# Patient Record
Sex: Male | Born: 1940 | Race: White | Hispanic: No | Marital: Single | Smoking: Never smoker
Health system: Southern US, Community
[De-identification: ages and names within clinical notes are randomized; demographics above are authoritative.]

## PROBLEM LIST (undated history)

## (undated) DIAGNOSIS — I10 Essential (primary) hypertension: Secondary | ICD-10-CM

## (undated) DIAGNOSIS — I214 Non-ST elevation (NSTEMI) myocardial infarction: Secondary | ICD-10-CM

## (undated) DIAGNOSIS — E119 Type 2 diabetes mellitus without complications: Secondary | ICD-10-CM

## (undated) DIAGNOSIS — I2510 Atherosclerotic heart disease of native coronary artery without angina pectoris: Secondary | ICD-10-CM

## (undated) DIAGNOSIS — E785 Hyperlipidemia, unspecified: Secondary | ICD-10-CM

## (undated) HISTORY — PX: CATARACT EXTRACTION: SUR2

## (undated) HISTORY — PX: CARDIAC CATHETERIZATION: SHX172

## (undated) HISTORY — PX: CORONARY STENT PLACEMENT: SHX1402

## (undated) HISTORY — PX: APPENDECTOMY: SHX54

---

## 2005-01-20 DIAGNOSIS — I2510 Atherosclerotic heart disease of native coronary artery without angina pectoris: Secondary | ICD-10-CM

## 2005-01-20 HISTORY — DX: Atherosclerotic heart disease of native coronary artery without angina pectoris: I25.10

## 2005-08-20 ENCOUNTER — Encounter: Payer: Self-pay | Admitting: Internal Medicine

## 2005-09-20 ENCOUNTER — Encounter: Payer: Self-pay | Admitting: Internal Medicine

## 2005-10-20 ENCOUNTER — Encounter: Payer: Self-pay | Admitting: Internal Medicine

## 2005-11-20 ENCOUNTER — Encounter: Payer: Self-pay | Admitting: Internal Medicine

## 2006-01-20 HISTORY — PX: COLONOSCOPY: SHX174

## 2007-01-05 ENCOUNTER — Ambulatory Visit: Payer: Self-pay | Admitting: Gastroenterology

## 2007-07-24 ENCOUNTER — Other Ambulatory Visit: Payer: Self-pay

## 2007-07-24 ENCOUNTER — Emergency Department: Payer: Self-pay | Admitting: Emergency Medicine

## 2007-08-02 ENCOUNTER — Ambulatory Visit: Payer: Self-pay | Admitting: Oncology

## 2007-08-21 ENCOUNTER — Ambulatory Visit: Payer: Self-pay | Admitting: Oncology

## 2012-04-28 ENCOUNTER — Ambulatory Visit: Payer: Self-pay | Admitting: Gastroenterology

## 2012-04-28 HISTORY — PX: COLONOSCOPY: SHX174

## 2013-01-20 DIAGNOSIS — I214 Non-ST elevation (NSTEMI) myocardial infarction: Secondary | ICD-10-CM

## 2013-01-20 HISTORY — DX: Non-ST elevation (NSTEMI) myocardial infarction: I21.4

## 2013-01-21 ENCOUNTER — Inpatient Hospital Stay: Payer: Self-pay | Admitting: Family Medicine

## 2013-01-21 LAB — CBC WITH DIFFERENTIAL/PLATELET
BASOS PCT: 0.5 %
Basophil #: 0.1 10*3/uL (ref 0.0–0.1)
Eosinophil #: 0.1 10*3/uL (ref 0.0–0.7)
Eosinophil %: 0.9 %
HCT: 38.7 % — ABNORMAL LOW (ref 40.0–52.0)
HGB: 13 g/dL (ref 13.0–18.0)
LYMPHS ABS: 1.2 10*3/uL (ref 1.0–3.6)
Lymphocyte %: 10 %
MCH: 28.7 pg (ref 26.0–34.0)
MCHC: 33.6 g/dL (ref 32.0–36.0)
MCV: 86 fL (ref 80–100)
MONOS PCT: 3.3 %
Monocyte #: 0.4 x10 3/mm (ref 0.2–1.0)
NEUTROS ABS: 10.1 10*3/uL — AB (ref 1.4–6.5)
Neutrophil %: 85.3 %
PLATELETS: 180 10*3/uL (ref 150–440)
RBC: 4.53 10*6/uL (ref 4.40–5.90)
RDW: 13.6 % (ref 11.5–14.5)
WBC: 11.9 10*3/uL — ABNORMAL HIGH (ref 3.8–10.6)

## 2013-01-21 LAB — COMPREHENSIVE METABOLIC PANEL
ALBUMIN: 4 g/dL (ref 3.4–5.0)
ALK PHOS: 107 U/L
AST: 33 U/L (ref 15–37)
Anion Gap: 5 — ABNORMAL LOW (ref 7–16)
BILIRUBIN TOTAL: 0.4 mg/dL (ref 0.2–1.0)
BUN: 29 mg/dL — ABNORMAL HIGH (ref 7–18)
CALCIUM: 9.4 mg/dL (ref 8.5–10.1)
CREATININE: 1.36 mg/dL — AB (ref 0.60–1.30)
Chloride: 108 mmol/L — ABNORMAL HIGH (ref 98–107)
Co2: 24 mmol/L (ref 21–32)
GFR CALC AF AMER: 60 — AB
GFR CALC NON AF AMER: 52 — AB
Glucose: 97 mg/dL (ref 65–99)
OSMOLALITY: 280 (ref 275–301)
Potassium: 4.4 mmol/L (ref 3.5–5.1)
SGPT (ALT): 20 U/L (ref 12–78)
Sodium: 137 mmol/L (ref 136–145)
Total Protein: 6.9 g/dL (ref 6.4–8.2)

## 2013-01-21 LAB — CK-MB
CK-MB: 20.6 ng/mL — ABNORMAL HIGH (ref 0.5–3.6)
CK-MB: 40.9 ng/mL — ABNORMAL HIGH (ref 0.5–3.6)

## 2013-01-21 LAB — APTT: Activated PTT: 27.4 secs (ref 23.6–35.9)

## 2013-01-21 LAB — PRO B NATRIURETIC PEPTIDE: B-TYPE NATIURETIC PEPTID: 52 pg/mL (ref 0–125)

## 2013-01-21 LAB — TROPONIN I
TROPONIN-I: 1.8 ng/mL — AB
Troponin-I: 0.12 ng/mL — ABNORMAL HIGH
Troponin-I: 0.7 ng/mL — ABNORMAL HIGH

## 2013-01-22 LAB — CBC WITH DIFFERENTIAL/PLATELET
Basophil #: 0.1 10*3/uL (ref 0.0–0.1)
Basophil %: 0.8 %
EOS ABS: 0.6 10*3/uL (ref 0.0–0.7)
Eosinophil %: 5.8 %
HCT: 35.2 % — ABNORMAL LOW (ref 40.0–52.0)
HGB: 12.2 g/dL — ABNORMAL LOW (ref 13.0–18.0)
LYMPHS ABS: 2.8 10*3/uL (ref 1.0–3.6)
Lymphocyte %: 25.5 %
MCH: 29.7 pg (ref 26.0–34.0)
MCHC: 34.7 g/dL (ref 32.0–36.0)
MCV: 86 fL (ref 80–100)
MONOS PCT: 7.2 %
Monocyte #: 0.8 x10 3/mm (ref 0.2–1.0)
NEUTROS ABS: 6.6 10*3/uL — AB (ref 1.4–6.5)
Neutrophil %: 60.7 %
Platelet: 166 10*3/uL (ref 150–440)
RBC: 4.1 10*6/uL — ABNORMAL LOW (ref 4.40–5.90)
RDW: 13.7 % (ref 11.5–14.5)
WBC: 10.9 10*3/uL — ABNORMAL HIGH (ref 3.8–10.6)

## 2013-01-22 LAB — BASIC METABOLIC PANEL
ANION GAP: 7 (ref 7–16)
BUN: 30 mg/dL — AB (ref 7–18)
CHLORIDE: 108 mmol/L — AB (ref 98–107)
CO2: 26 mmol/L (ref 21–32)
Calcium, Total: 8.9 mg/dL (ref 8.5–10.1)
Creatinine: 1.44 mg/dL — ABNORMAL HIGH (ref 0.60–1.30)
EGFR (African American): 56 — ABNORMAL LOW
EGFR (Non-African Amer.): 48 — ABNORMAL LOW
Glucose: 109 mg/dL — ABNORMAL HIGH (ref 65–99)
OSMOLALITY: 288 (ref 275–301)
Potassium: 4 mmol/L (ref 3.5–5.1)
SODIUM: 141 mmol/L (ref 136–145)

## 2013-01-22 LAB — APTT
Activated PTT: >160 secs (ref 23.6–35.9)
Activated PTT: 113.6 secs — ABNORMAL HIGH (ref 23.6–35.9)
Activated PTT: 62.2 secs — ABNORMAL HIGH (ref 23.6–35.9)
Activated PTT: 68.1 secs — ABNORMAL HIGH (ref 23.6–35.9)

## 2013-01-22 LAB — CK-MB: CK-MB: 132.6 ng/mL — ABNORMAL HIGH (ref 0.5–3.6)

## 2013-01-22 LAB — TROPONIN I: TROPONIN-I: 10 ng/mL — AB

## 2013-01-23 LAB — COMPREHENSIVE METABOLIC PANEL
ALT: 29 U/L (ref 12–78)
ANION GAP: 3 — AB (ref 7–16)
AST: 127 U/L — AB (ref 15–37)
Albumin: 3.4 g/dL (ref 3.4–5.0)
Alkaline Phosphatase: 94 U/L
BUN: 28 mg/dL — AB (ref 7–18)
Bilirubin,Total: 0.5 mg/dL (ref 0.2–1.0)
CALCIUM: 9.1 mg/dL (ref 8.5–10.1)
CHLORIDE: 109 mmol/L — AB (ref 98–107)
CREATININE: 1.28 mg/dL (ref 0.60–1.30)
Co2: 27 mmol/L (ref 21–32)
EGFR (African American): >60
GFR CALC NON AF AMER: 56 — AB
Glucose: 104 mg/dL — ABNORMAL HIGH (ref 65–99)
Osmolality: 283 (ref 275–301)
POTASSIUM: 4.9 mmol/L (ref 3.5–5.1)
SODIUM: 139 mmol/L (ref 136–145)
TOTAL PROTEIN: 6.2 g/dL — AB (ref 6.4–8.2)

## 2013-01-23 LAB — CBC WITH DIFFERENTIAL/PLATELET
BASOS ABS: 0.1 10*3/uL (ref 0.0–0.1)
BASOS PCT: 0.9 %
Eosinophil #: 0.9 10*3/uL — ABNORMAL HIGH (ref 0.0–0.7)
Eosinophil %: 10.5 %
HCT: 37.2 % — AB (ref 40.0–52.0)
HGB: 12.5 g/dL — ABNORMAL LOW (ref 13.0–18.0)
LYMPHS ABS: 2.6 10*3/uL (ref 1.0–3.6)
Lymphocyte %: 29.7 %
MCH: 28.9 pg (ref 26.0–34.0)
MCHC: 33.7 g/dL (ref 32.0–36.0)
MCV: 86 fL (ref 80–100)
MONOS PCT: 7.9 %
Monocyte #: 0.7 x10 3/mm (ref 0.2–1.0)
Neutrophil #: 4.5 10*3/uL (ref 1.4–6.5)
Neutrophil %: 51 %
Platelet: 150 10*3/uL (ref 150–440)
RBC: 4.33 10*6/uL — ABNORMAL LOW (ref 4.40–5.90)
RDW: 14.1 % (ref 11.5–14.5)
WBC: 8.8 10*3/uL (ref 3.8–10.6)

## 2013-01-23 LAB — APTT: Activated PTT: 74.7 secs — ABNORMAL HIGH (ref 23.6–35.9)

## 2013-01-24 LAB — CK TOTAL AND CKMB (NOT AT ARMC)
CK, TOTAL: 219 U/L (ref 35–232)
CK-MB: 5.7 ng/mL — ABNORMAL HIGH (ref 0.5–3.6)

## 2013-01-24 LAB — APTT
ACTIVATED PTT: 57.4 s — AB (ref 23.6–35.9)
Activated PTT: 54.3 secs — ABNORMAL HIGH (ref 23.6–35.9)

## 2013-01-25 LAB — BASIC METABOLIC PANEL
Anion Gap: 4 — ABNORMAL LOW (ref 7–16)
BUN: 23 mg/dL — AB (ref 7–18)
CHLORIDE: 104 mmol/L (ref 98–107)
Calcium, Total: 8.8 mg/dL (ref 8.5–10.1)
Co2: 27 mmol/L (ref 21–32)
Creatinine: 1.28 mg/dL (ref 0.60–1.30)
GFR CALC NON AF AMER: 56 — AB
Glucose: 99 mg/dL (ref 65–99)
OSMOLALITY: 274 (ref 275–301)
POTASSIUM: 4.3 mmol/L (ref 3.5–5.1)
Sodium: 135 mmol/L — ABNORMAL LOW (ref 136–145)

## 2013-01-25 LAB — HEMOGLOBIN: HGB: 12.2 g/dL — ABNORMAL LOW (ref 13.0–18.0)

## 2013-01-25 LAB — PLATELET COUNT: Platelet: 148 10*3/uL — ABNORMAL LOW (ref 150–440)

## 2013-02-17 ENCOUNTER — Encounter: Payer: Self-pay | Admitting: Cardiology

## 2013-02-20 ENCOUNTER — Encounter: Payer: Self-pay | Admitting: Cardiology

## 2013-12-09 ENCOUNTER — Emergency Department: Payer: Self-pay | Admitting: Emergency Medicine

## 2013-12-13 ENCOUNTER — Emergency Department: Payer: Self-pay | Admitting: Emergency Medicine

## 2014-05-13 NOTE — H&P (Signed)
PATIENT NAME:  Omar Schmidt, [name] DATE OF BIRTH:  10-01-1940  DATE OF ADMISSION:  01/21/2013  PRIMARY CARE PHYSICIAN: Dr. Burnadette Schmidt  PRIMARY CARDIOLOGIST: Dr. Darrold Schmidt.   REFERRING EMERGENCY ROOM PHYSICIAN: Dr. Dorothea GlassmanPaul Schmidt.   CHIEF COMPLAINT: Chest pain.  HISTORY OF PRESENT ILLNESS: A 74 year old male with past medical history of a syncopal episode and followed by further work-up and [facility] two [facility]. History of  hypertension and hyperlipidemia. 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 So he sat down and decided to take a rest, but the pain did not go away for almost 1-1/2 hours, so he decided to go to the fire station, which was just nearby and they called the paramedics, checked his blood pressure and vitals and EKG. Everything was within normal limits, but the pain was very typical so they decided to bring him to the Emergency Room. On the way to ER, they gave him some aspirin and he said that after almost 2 hours, his pain started tapering down and gradually resolved.  Currently, he does not have any pain. In the ER, he was found having troponin is 0.12 so being admitted for further work-up of coronary artery disease as he has a strong history.    REVIEW OF SYSTEMS: CONSTITUTIONAL: Negative for fever, fatigue, weakness. Positive for chest pain.  EYES: No blurring, double vision, discharge or redness.  EARS, NOSE, THROAT: No tinnitus, ear pain or hearing loss.  RESPIRATORY: No cough, wheezing, hemoptysis, or shortness of breath.  CARDIOVASCULAR: Had some chest pain, but no orthopnea, palpitations or edema.  GASTROINTESTINAL: No nausea,  vomiting, diarrhea, abdominal pain.  GENITOURINARY: No dysuria, hematuria or increased frequency.  ENDOCRINE: No increased sweating. No heat or cold intolerance.  SKIN: No acne, rashes, or lesions.  MUSCULOSKELETAL: No pain or swelling in the joints.  NEUROLOGICAL: No numbness, weakness, tremor or vertigo.  PSYCHIATRIC: No anxiety, insomnia, bipolar disorder.   PAST MEDICAL HISTORY: Hypertension, hyperlipidemia, stent in 2 vessels 8 years ago.   PAST SURGICAL HISTORY: Appendix surgery almost 30 years ago.   SOCIAL HISTORY: He lives with wife, retired, was working at [employer]. Currently, very active retired life. 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 Denies smoking, drinking alcohol or illegal drug use.   FAMILY HISTORY: Positive for MI in his father in 5760s, but his 2 brothers, which were elder to him, they died at 4645 and 74 years of age because of MI.   HOME MEDICATIONS:  1.  Aspirin 81 mg daily.  2.  Atorvastatin 40 mg daily.  3.  Lisinopril 10 mg daily.   PHYSICAL EXAMINATION:   VITAL SIGNS:  In ER, temperature 98.6, pulse 62, respiration 18, blood pressure 127/84, pulse ox 89% on room air.  GENERAL: The patient is fully alert and oriented to time, place, and person. Does not appear in any acute distress.  HEENT: Head and neck atraumatic. Conjunctivae pink. Oral mucosa moist.  NECK: Supple. No JVD.  RESPIRATORY: Bilateral clear and equal air entry.  CARDIOVASCULAR: S1, S2 present,  regular. No murmur.  ABDOMEN: Soft, nontender. Bowel sounds present. No organomegaly.  SKIN: No rashes. LEGS: No edema.  NEUROLOGICAL: Power 5 out of 5. Follows commands. No gross abnormality.  JOINTS: No swelling or tenderness.  PSYCHIATRIC: Does not appear in any acute psychiatric illness.   IMPORTANT LABORATORY RESULTS:  Glucose 97, BNP 52, BUN 29, creatinine 1.36, sodium 137, potassium 4.4, chloride 108, CO2 24, calcium 9.4.   Troponin 0.12. WBC 11.9,  hemoglobin 13.0, platelet count 180, MCV 86.   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   ASSESSMENT AND PLAN: A 74 year old male with past medical history of 2 stents, hypertension, hyperlipidemia, and strong family history of having MI and early age death.  Because of that, came to Emergency Room after having chest pain from working out 2 hours and elevated troponin. EKG: Normal.  1.  Chest pain.  We will admit him to telemetry and follow serial troponins. We will do stress test. If his troponins come out to be negative or remain stable at this level.  2.  Hypertension. We will continue lisinopril as he was taking at home.  3.  Hyperlipidemia. We will continue statin as he was taking at home.  4.  Coronary artery disease, status post stent 8 years ago. We will continue aspirin as he was taking before.   TOTAL TIME SPENT ON THIS ADMISSION: 50 minutes.   ____________________________ Omar Schmidt Omar Pigeon, MD [id number] D: 01/21/2013 16:40:19 ET T: 01/21/2013 17:08:00 ET JOB#: [id number]  cc: Omar Schmidt. Omar Pigeon, MD, <Dictator> Omar Ivan, MD Omar Millard, MD Omar Dilling MD ELECTRONICALLY SIGNED 01/26/2013 13:52

## 2014-05-13 NOTE — Discharge Summary (Signed)
PATIENT NAME:  Omar Schmidt, Omar Schmidt MR#:  [id number] DATE OF BIRTH:  [birth year]  DATE OF ADMISSION:  01/21/2013 DATE OF DISCHARGE:  01/25/2013  DISCHARGE DIAGNOSES:  1.  Non-ST-elevation myocardial infarction.  2.  History of coronary artery disease.  3.  History of hypertension.  4.  History of hyperlipidemia.   DISCHARGE MEDICATIONS:  1.  Atorvastatin 40 mg p.o. at bedtime.  2.  Lisinopril 10 mg p.o. daily.  3.  Nitroglycerin 0.4 mg sublingual q.5 minutes x 3 doses as needed for acute chest pain.  4.  Aspirin 325 mg p.o. daily.  5.  Clopidogrel 75 mg p.o. daily.   CONSULTS: Cardiology.   PROCEDURES: The patient underwent cardiac catheterization that showed a 90% stenosis of the RCA and underwent a stent of the RCA.   PERTINENT LABS: Upon admission, troponin 0.12, 0.7, 1.8, and 10. On day of discharge, sodium 135, potassium 4.3, creatinine 1.28, glucose 99, hemoglobin 12.2 and platelets 148.   BRIEF HOSPITAL COURSE:  1.  NSTEMI. The patient came in with acute substernal chest discomfort with a history of coronary artery disease, consistent with an NSTEMI. Initial EKG showed no acute ST wave changes. Troponins did elevate to a max of 10. The patient underwent catheterization, which showed a 90% blockage of the RCA. He underwent stent placement and is currently on Plavix and aspirin and doing well and asymptomatic. He will likely need cardiac rehab and follow up with Dr. Darrold JunkerParaschos in a week. Unable to place on a beta blocker because of history of intolerance due to symptomatic bradycardia. Will continue with statin, ACE inhibitor, nitroglycerin, aspirin and Plavix at this time.  2.  Other chronic medical issues treated as stated above.   DISPOSITION: He is in stable condition to be discharged to home. Follow up with Dr. Darrold JunkerParaschos in a week. Follow up with Dr. Burnadette PopLinthavong within 10 days.  ____________________________ Marisue IvanKanhka Michalene Debruler, MD kl:aw D: 01/25/2013 09:30:33 ET T: 01/25/2013 09:48:34  ET JOB#: [id number]  cc: Marisue IvanKanhka Kasra Melvin, MD, <Dictator> Marisue IvanKANHKA Niklas Chretien MD ELECTRONICALLY SIGNED 01/27/2013 10:35

## 2014-05-13 NOTE — Consult Note (Signed)
   Present Illness 74 yo male with history of cad s/p pci in the past now presenting with weakness, fatigue, midsternal chest pain and has ruled in for a nstemi. Currently pain free. Troponin is 10. EKG shows sinius bradycardia with no injury or ischemic changes. Pt is relatively bradycardic and hypotensive. Not a candidate for beta blockers due to bradycardia.   Physical Exam:  GEN well developed, well nourished, no acute distress   HEENT PERRL, hearing intact to voice   NECK supple   RESP normal resp effort  clear BS  no use of accessory muscles   CARD Regular rate and rhythm  Normal, S1, S2   ABD denies tenderness  no liver/spleen enlargement  no hernia  no Adominal Mass   LYMPH negative neck   EXTR negative cyanosis/clubbing, negative edema   SKIN normal to palpation   NEURO cranial nerves intact, motor/sensory function intact   PSYCH A+O to time, place, person   Review of Systems:  Subjective/Chief Complaint chest pain   General: Fatigue   Skin: No Complaints   ENT: No Complaints   Eyes: No Complaints   Neck: No Complaints   Respiratory: No Complaints   Cardiovascular: Chest pain or discomfort  Tightness   Gastrointestinal: No Complaints   Genitourinary: No Complaints   Vascular: No Complaints   Musculoskeletal: No Complaints   Neurologic: No Complaints   Hematologic: No Complaints   Endocrine: No Complaints   Psychiatric: No Complaints   Review of Systems: All other systems were reviewed and found to be negative   Medications/Allergies Reviewed Medications/Allergies reviewed   EKG:  Abnormal NSSTTW changes   Interpretation sinus bradycardia   Rate 55    No Known Allergies:    Impression 74 yo male with history of cad s/p pci, treated with asa, statin and lisinopril now admitted with chest pain and fatigue. Ruled in for a nstemi. Has relative bradsycardia and hypotension but near his baseline blood pressure and heart rate. Stable on  heparin. Not a candidate for beta blockers due to bradycardia. WIll continue with heparin and asa and atorvastatin. Proceed with cardiac cath when available.   Plan 1. Conitnue heparin, asa, atorvastatin 2. Avoid beta blockers due to heart rate 3. Risk and benefits of cath explained. 4. Proceed with cardiac cath on Monday   Electronic Signatures: Dalia HeadingFath, Kenneth A (MD)  (Signed 03-Jan-15 09:05)  Authored: General Aspect/Present Illness, History and Physical Exam, Review of System, EKG , Allergies, Impression/Plan   Last Updated: 03-Jan-15 09:05 by Dalia HeadingFath, Kenneth A (MD)

## 2014-09-09 IMAGING — CR DG CHEST 2V
1 series · 3 of 3 positions shown · non-contrast
Comparison: None.

CLINICAL DATA: Chest pressure, chest pain

EXAM:
CHEST  2 VIEW

[Series 1: pa · 0.17mm/px · 3 of 3 slices shown]
[im 1/3]
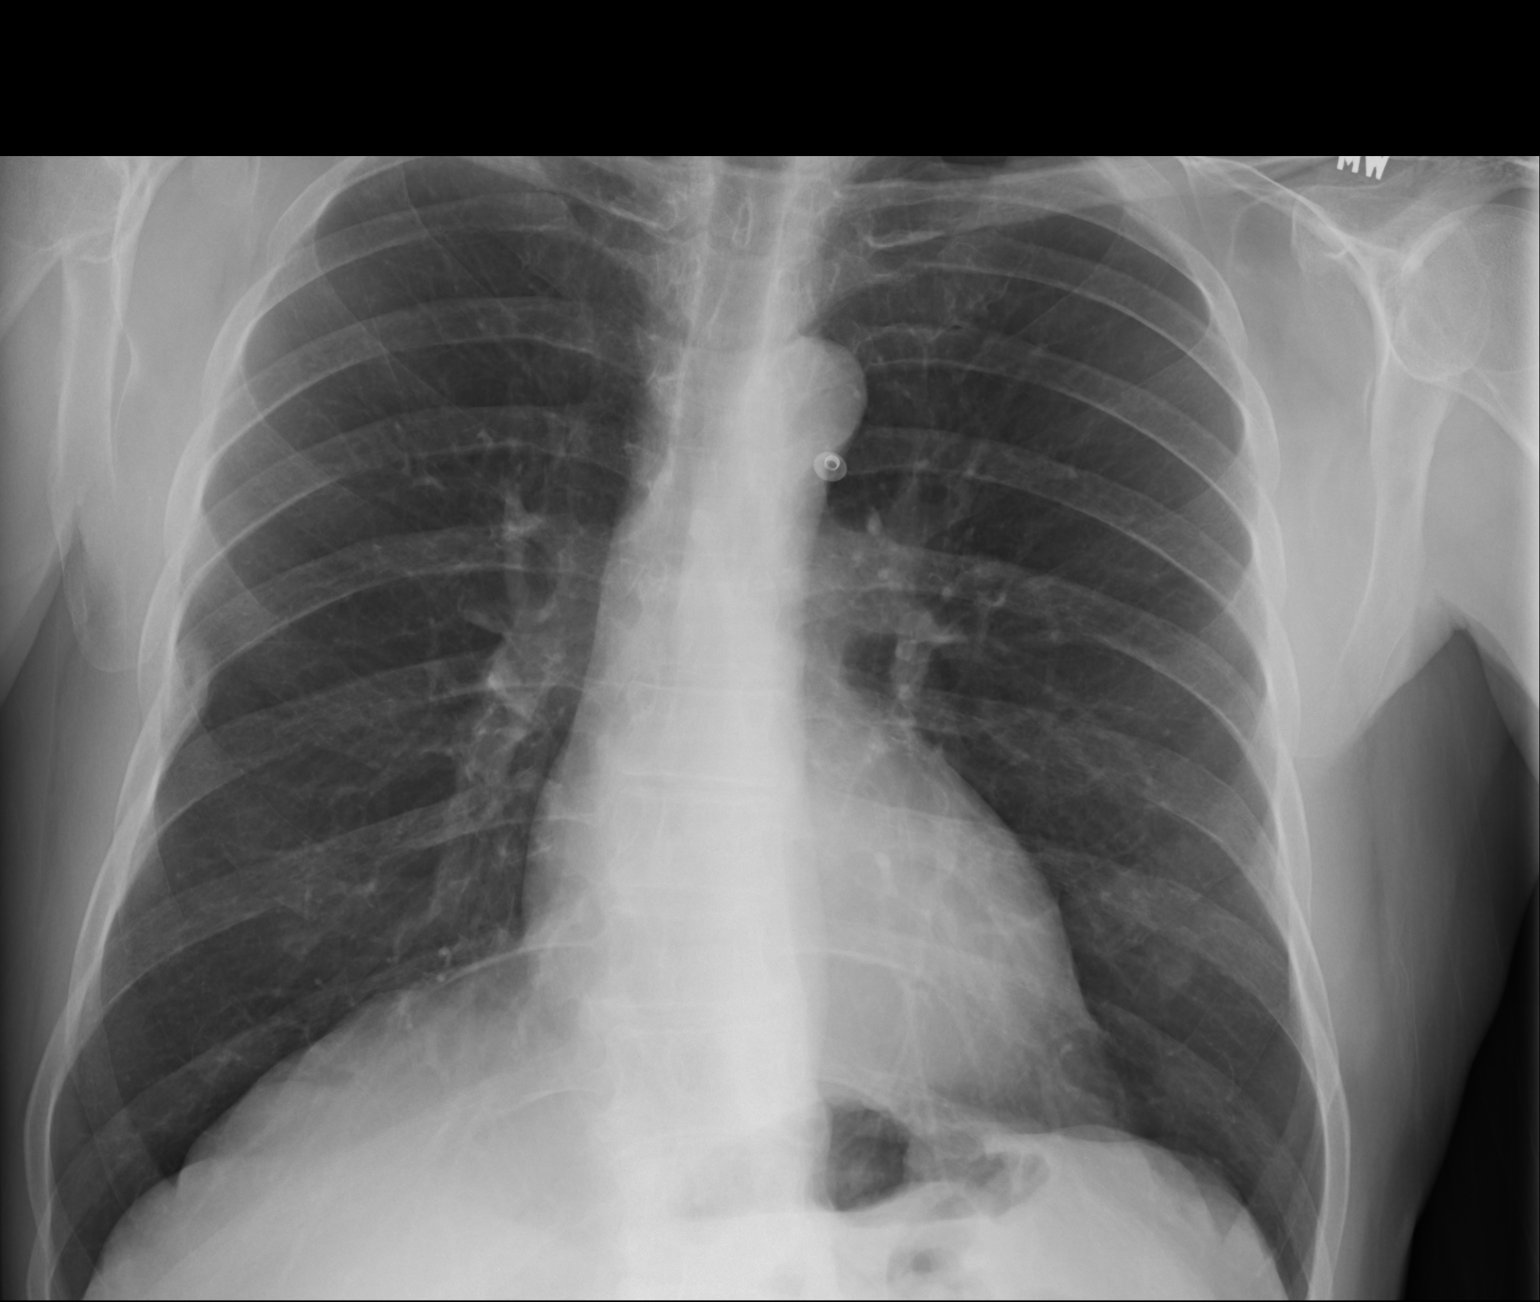
[im 2/3]
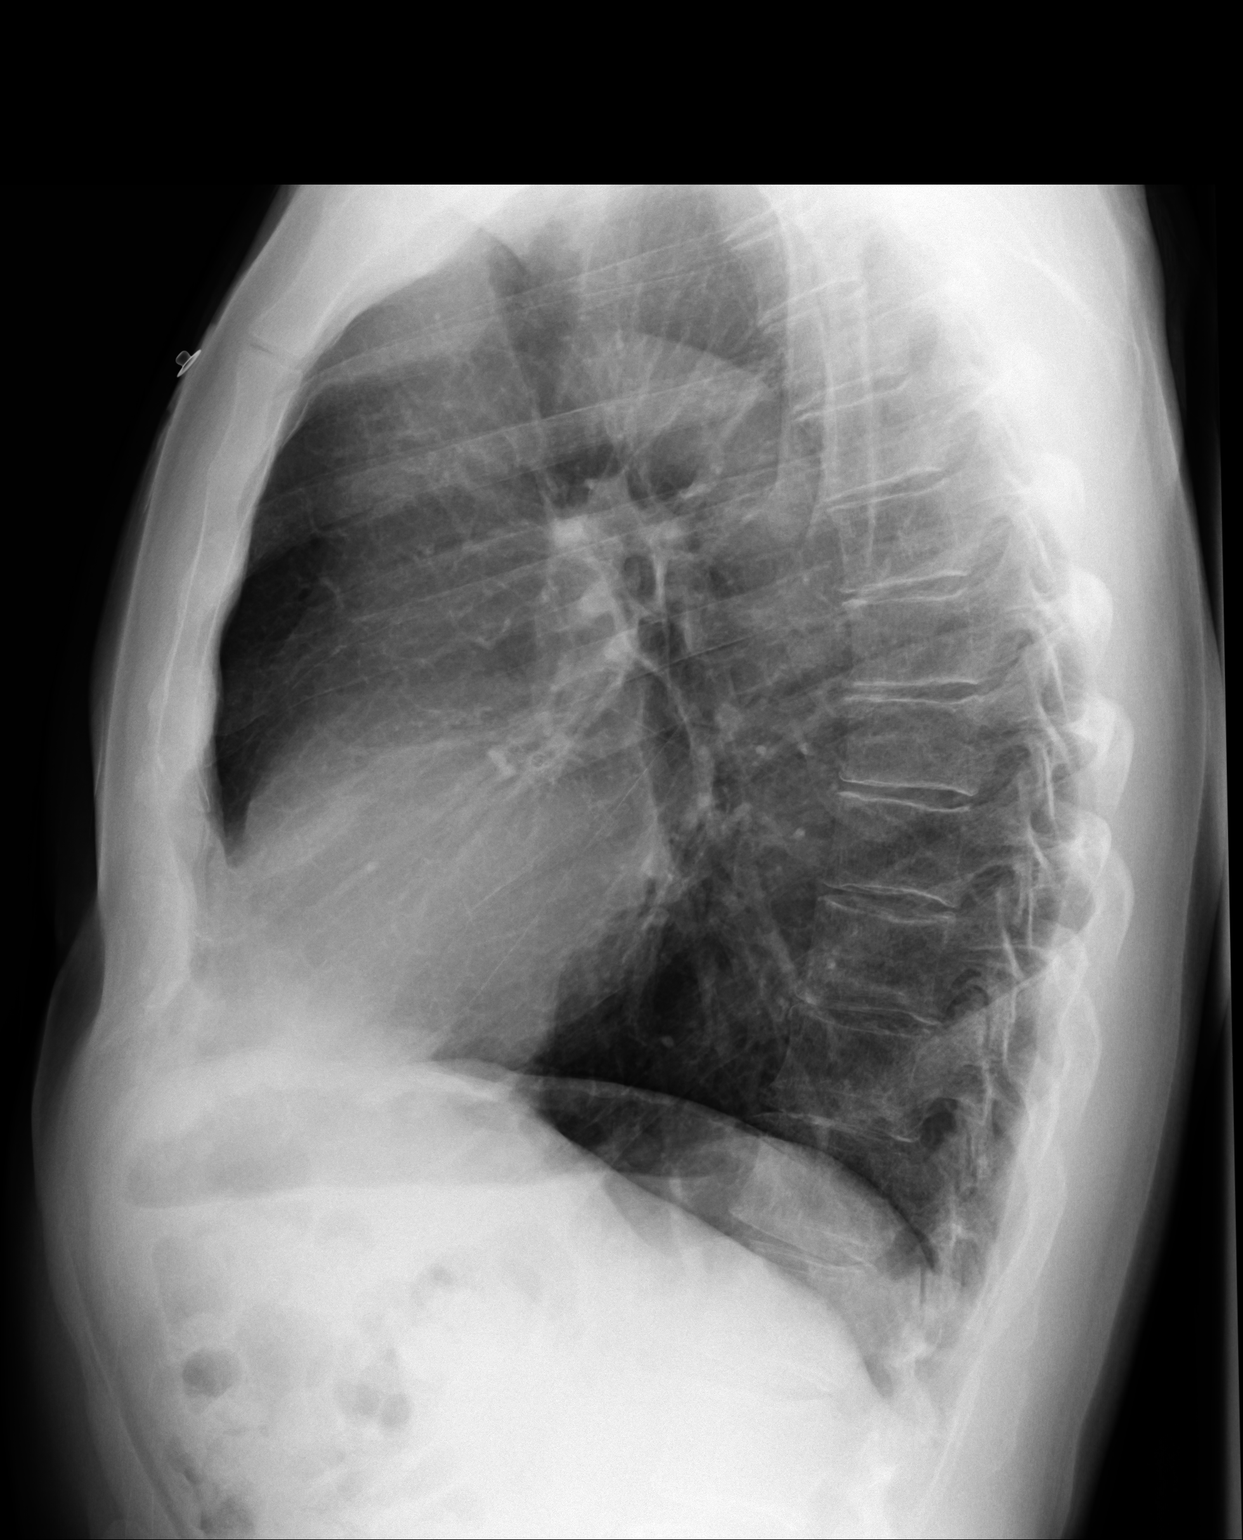
[im 3/3]
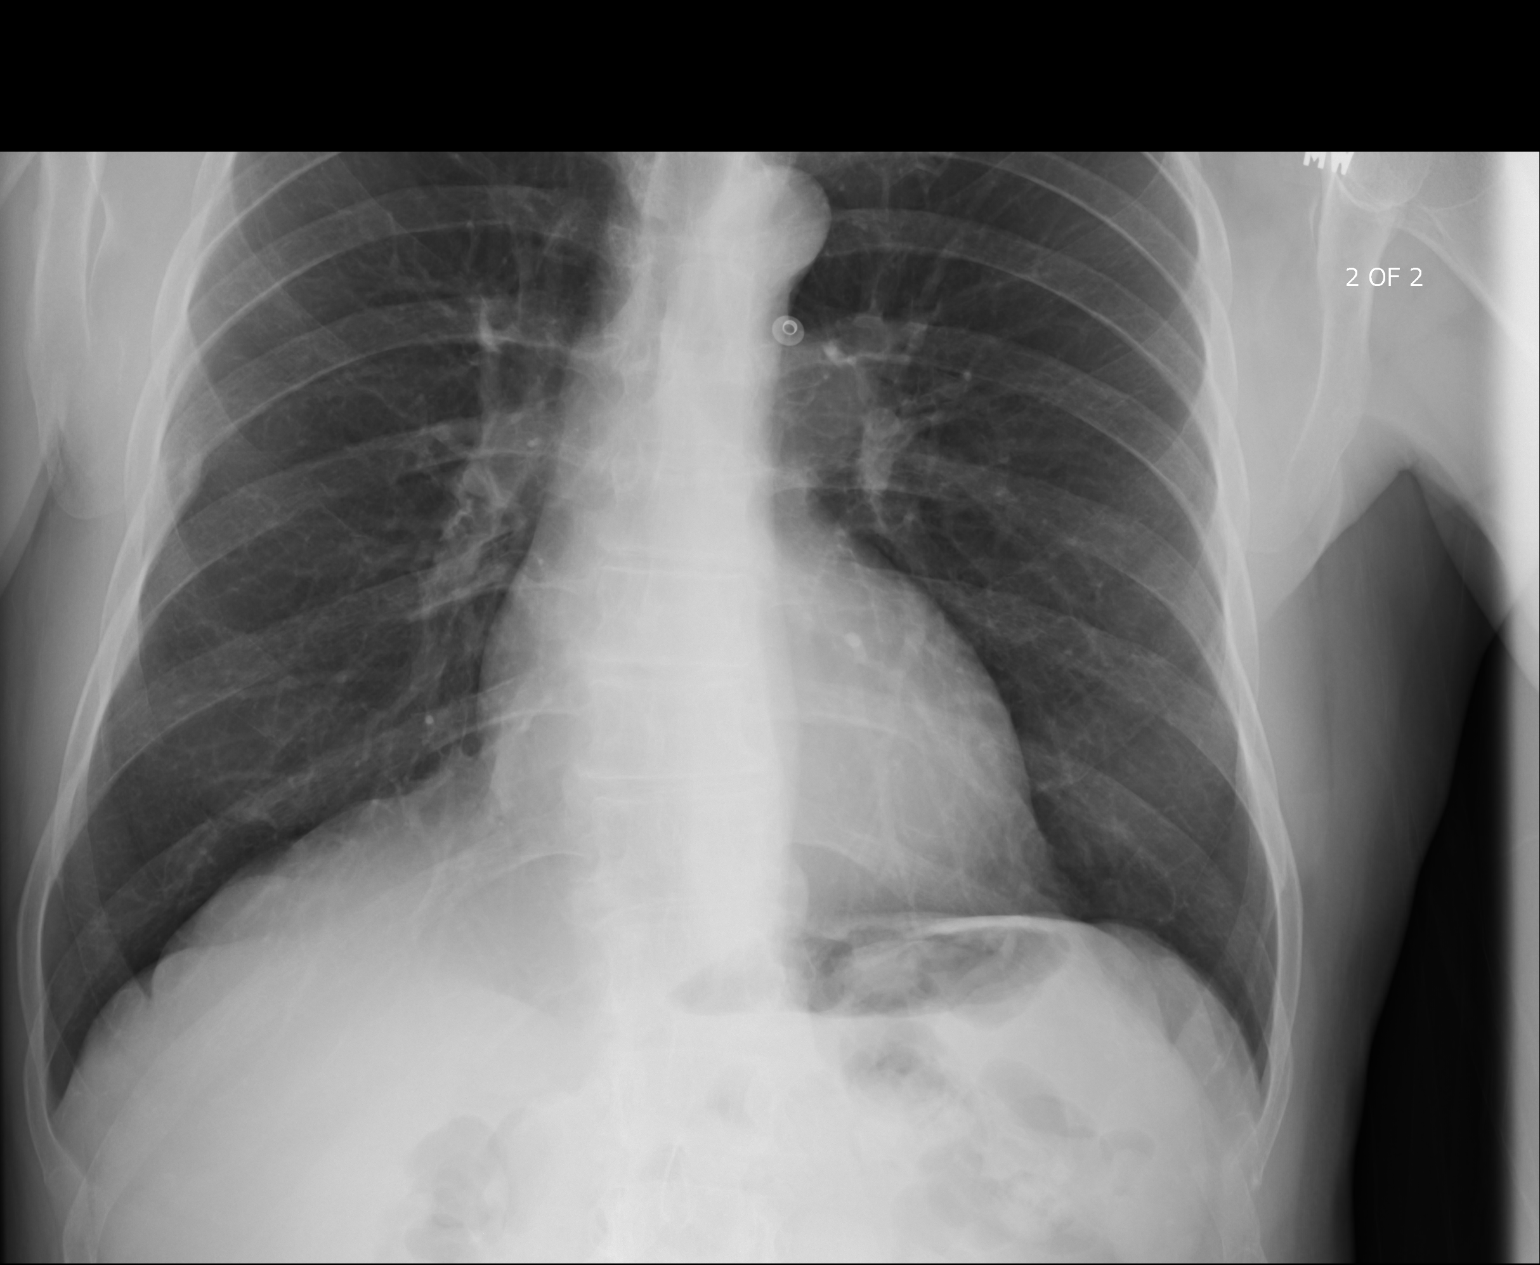

[3 of 3 positions shown; findings below may reference images not displayed]

FINDINGS: The heart size and mediastinal contours are within normal limits.
Both lungs are clear. The lungs are hyperinflated. There is
flattening of the hemidiaphragms and increased AP diameter of the
chest. There is mild dextroscoliosis of the thoracolumbar spine
likely positional.
IMPRESSION: COPD without evidence of acute cardiopulmonary disease.

## 2017-12-01 ENCOUNTER — Encounter: Payer: Self-pay | Admitting: *Deleted

## 2017-12-02 ENCOUNTER — Ambulatory Visit: Payer: Medicare Other | Admitting: Certified Registered Nurse Anesthetist

## 2017-12-02 ENCOUNTER — Other Ambulatory Visit: Payer: Self-pay

## 2017-12-02 ENCOUNTER — Encounter
Admission: RE | Disposition: A | Discharge status: Home / Self Care | Payer: Self-pay | Source: Ambulatory Visit | Attending: Internal Medicine

## 2017-12-02 ENCOUNTER — Encounter: Payer: Self-pay | Admitting: Student

## 2017-12-02 ENCOUNTER — Ambulatory Visit
Admission: RE | Admit: 2017-12-02 | Discharge: 2017-12-02 | Disposition: A | Discharge status: Home / Self Care | Payer: Medicare Other | Source: Ambulatory Visit | Attending: Internal Medicine | Admitting: Internal Medicine

## 2017-12-02 DIAGNOSIS — I10 Essential (primary) hypertension: Secondary | ICD-10-CM | POA: Diagnosis not present

## 2017-12-02 DIAGNOSIS — Z1211 Encounter for screening for malignant neoplasm of colon: Secondary | ICD-10-CM | POA: Diagnosis not present

## 2017-12-02 DIAGNOSIS — Z86010 Personal history of colonic polyps: Secondary | ICD-10-CM | POA: Diagnosis not present

## 2017-12-02 DIAGNOSIS — E119 Type 2 diabetes mellitus without complications: Secondary | ICD-10-CM | POA: Insufficient documentation

## 2017-12-02 DIAGNOSIS — Z79899 Other long term (current) drug therapy: Secondary | ICD-10-CM | POA: Diagnosis not present

## 2017-12-02 DIAGNOSIS — K5730 Diverticulosis of large intestine without perforation or abscess without bleeding: Secondary | ICD-10-CM | POA: Diagnosis not present

## 2017-12-02 DIAGNOSIS — Z7982 Long term (current) use of aspirin: Secondary | ICD-10-CM | POA: Diagnosis not present

## 2017-12-02 DIAGNOSIS — E785 Hyperlipidemia, unspecified: Secondary | ICD-10-CM | POA: Insufficient documentation

## 2017-12-02 DIAGNOSIS — K640 First degree hemorrhoids: Secondary | ICD-10-CM | POA: Insufficient documentation

## 2017-12-02 DIAGNOSIS — I252 Old myocardial infarction: Secondary | ICD-10-CM | POA: Diagnosis not present

## 2017-12-02 HISTORY — DX: Type 2 diabetes mellitus without complications: E11.9

## 2017-12-02 HISTORY — PX: COLONOSCOPY WITH PROPOFOL: SHX5780

## 2017-12-02 HISTORY — DX: Non-ST elevation (NSTEMI) myocardial infarction: I21.4

## 2017-12-02 HISTORY — DX: Essential (primary) hypertension: I10

## 2017-12-02 HISTORY — DX: Hyperlipidemia, unspecified: E78.5

## 2017-12-02 HISTORY — DX: Atherosclerotic heart disease of native coronary artery without angina pectoris: I25.10

## 2017-12-02 SURGERY — COLONOSCOPY WITH PROPOFOL
Anesthesia: General

## 2017-12-02 MED ORDER — KETOROLAC TROMETHAMINE 30 MG/ML IJ SOLN: INTRAMUSCULAR | Status: DC | PRN

## 2017-12-02 MED ORDER — EPHEDRINE SULFATE 50 MG/ML IJ SOLN
INTRAMUSCULAR | Status: DC | PRN
  Administered 2017-12-02: 5 mg via INTRAVENOUS

## 2017-12-02 MED ORDER — PROPOFOL 10 MG/ML IV BOLUS
INTRAVENOUS | Status: DC | PRN
  Administered 2017-12-02: 70 mg via INTRAVENOUS

## 2017-12-02 MED ORDER — SODIUM CHLORIDE 0.9 % IV SOLN
INTRAVENOUS | Status: DC
  Administered 2017-12-02: 09:00:00 via INTRAVENOUS

## 2017-12-02 MED ORDER — PROPOFOL 500 MG/50ML IV EMUL
INTRAVENOUS | Status: DC | PRN
  Administered 2017-12-02: 160 ug/kg/min via INTRAVENOUS

## 2017-12-02 NOTE — Anesthesia Procedure Notes (Signed)
Performed by: Demonica Farrey, CRNA Pre-anesthesia Checklist: Patient identified, Emergency Drugs available, Suction available, Timeout performed and Patient being monitored Patient Re-evaluated:Patient Re-evaluated prior to induction Oxygen Delivery Method: Nasal cannula Induction Type: IV induction       

## 2017-12-02 NOTE — Interval H&P Note (Signed)
History and Physical Interval Note:  12/02/2017 8:39 AM  Omar Schmidt  has presented today for surgery, with the diagnosis of P/H COLON POLYPS  The various methods of treatment have been discussed with the patient and family. After consideration of risks, benefits and other options for treatment, the patient has consented to  Procedure(s): COLONOSCOPY WITH PROPOFOL (N/A) as a surgical intervention .  The patient's history has been reviewed, patient examined, no change in status, stable for surgery.  I have reviewed the patient's chart and labs.  Questions were answered to the patient's satisfaction.     [place], [place]

## 2017-12-02 NOTE — Anesthesia Post-op Follow-up Note (Signed)
Anesthesia QCDR form completed.        

## 2017-12-02 NOTE — H&P (Signed)
Outpatient short stay form Pre-procedure 12/02/2017 8:38 AM Teodoro K. Norma Fredricksonoledo, M.D.  Primary Physician: Iantha FallenKahnka Linthavong, M.D.  Reason for visit:  Personal hx of colon polyps  History of present illness:                           Patient presents for colonoscopy for a personal hx of colon polyps. The patient denies abdominal pain, abnormal weight loss or rectal bleeding.    No current facility-administered medications for this encounter.   Medications Prior to Admission  Medication Sig Dispense Refill Last Dose  . amLODipine (NORVASC) 5 MG tablet Take 5 mg by mouth daily.   12/02/2017 at 0530  . aspirin EC 81 MG tablet Take 81 mg by mouth daily.   12/01/2017 at Unknown time  . atorvastatin (LIPITOR) 40 MG tablet Take 40 mg by mouth daily.     . clopidogrel (PLAVIX) 75 MG tablet Take 75 mg by mouth daily.     . nitroGLYCERIN (NITROSTAT) 0.4 MG SL tablet Place 0.4 mg under the tongue every 5 (five) minutes as needed for chest pain.     Marland Kitchen. tiZANidine (ZANAFLEX) 4 MG tablet Take 4 mg by mouth as needed for muscle spasms.        Not on File   Past Medical History:  Diagnosis Date  . Coronary artery disease 2007  . Diabetes mellitus without complication (HCC)   . Hyperlipidemia   . Hypertension   . NSTEMI (non-ST elevated myocardial infarction) (HCC) 01/2013    Review of systems:  Otherwise negative.    Physical Exam  Gen: Alert, oriented. Appears stated age.  HEENT: [place]/AT. PERRLA. Lungs: CTA, no wheezes. CV: RR nl S1, S2. Abd: soft, benign, no masses. BS+ Ext: No edema. Pulses 2+    Planned procedures: Proceed with colonoscopy. The patient understands the nature of the planned procedure, indications, risks, alternatives and potential complications including but not limited to bleeding, infection, perforation, damage to internal organs and possible oversedation/side effects from anesthesia. The patient agrees and gives consent to proceed.  Please refer to procedure notes  for findings, recommendations and patient disposition/instructions.     Teodoro K. Norma Fredricksonoledo, M.D. Gastroenterology 12/02/2017  8:38 AM

## 2017-12-02 NOTE — Anesthesia Preprocedure Evaluation (Signed)
Anesthesia Evaluation  Patient identified by MRN, date of birth, ID band Patient awake    Reviewed: Allergy & Precautions, H&P , NPO status , Patient's Chart, lab work & pertinent test results, reviewed documented beta blocker date and time   Airway Mallampati: II   Neck ROM: full    Dental  (+) Poor Dentition   Pulmonary neg pulmonary ROS,    Pulmonary exam normal        Cardiovascular Exercise Tolerance: Good hypertension, On Medications + CAD and + Past MI  negative cardio ROS Normal cardiovascular exam Rhythm:regular Rate:Normal     Neuro/Psych negative neurological ROS  negative psych ROS   GI/Hepatic negative GI ROS, Neg liver ROS,   Endo/Other  negative endocrine ROSdiabetes, Well Controlled, Type 2  Renal/GU negative Renal ROS  negative genitourinary   Musculoskeletal   Abdominal   Peds  Hematology negative hematology ROS (+)   Anesthesia Other Findings Past Medical History: 2007: Coronary artery disease No date: Diabetes mellitus without complication (HCC) No date: Hyperlipidemia No date: Hypertension 01/2013: NSTEMI (non-ST elevated myocardial infarction) [facility](HCC) Past Surgical History: No date: APPENDECTOMY No date: CARDIAC CATHETERIZATION No date: CATARACT EXTRACTION; Bilateral 2008: COLONOSCOPY 04/28/2012: COLONOSCOPY No date: CORONARY STENT PLACEMENT     Comment:  x2 BMI    Body Mass Index:  22.96 kg/m     Reproductive/Obstetrics negative OB ROS                             Anesthesia Physical Anesthesia Plan  ASA: III  Anesthesia Plan: General   Post-op Pain Management:    Induction:   PONV Risk Score and Plan:   Airway Management Planned:   Additional Equipment:   Intra-op Plan:   Post-operative Plan:   Informed Consent: I have reviewed the patients History and Physical, chart, labs and discussed the procedure including the risks, benefits and  alternatives for the proposed anesthesia with the patient or authorized representative who has indicated his/her understanding and acceptance.   Dental Advisory Given  Plan Discussed with: CRNA  Anesthesia Plan Comments:         Anesthesia Quick Evaluation

## 2017-12-02 NOTE — Op Note (Signed)
[facility] Gastroenterology Patient Name: Trisha Morandi Procedure Date: 12/02/2017 8:44 AM MRN: [id number] Account #: [id number] Date of Birth: 11/07/1940 Admit Type: Outpatient Age: 77 Room: [facility] ENDO ROOM 3 Gender: Male Note Status: Finalized Procedure:            Colonoscopy Indications:          High risk colon cancer surveillance: Personal history                        of colonic polyps Providers:            Boykin Nearing. Norma Fredrickson MD, MD Referring MD:         Marisue Ivan (Referring MD) Medicines:            Propofol per Anesthesia Complications:        No immediate complications. Procedure:            Pre-Anesthesia Assessment:                       - The risks and benefits of the procedure and the                        sedation options and risks were discussed with the                        patient. All questions were answered and informed                        consent was obtained.                       - Patient identification and proposed procedure were                        verified prior to the procedure by the nurse. The                        procedure was verified in the procedure room.                       - ASA Grade Assessment: III - A patient with severe                        systemic disease.                       - After reviewing the risks and benefits, the patient                        was deemed in satisfactory condition to undergo the                        procedure.                       After obtaining informed consent, the colonoscope was                        passed under direct vision. Throughout the procedure,  the patient's blood pressure, pulse, and oxygen                        saturations were monitored continuously. The                        Colonoscope was introduced through the anus and                        advanced to the the cecum, identified by appendiceal                        orifice  and ileocecal valve. The colonoscopy was                        performed without difficulty. The patient tolerated the                        procedure well. Findings:      The perianal and digital rectal examinations were normal. Pertinent       negatives include normal sphincter tone and no palpable rectal lesions.      Many small and large-mouthed diverticula were found in the entire colon.      Non-bleeding internal hemorrhoids were found during retroflexion. The       hemorrhoids were Grade I (internal hemorrhoids that do not prolapse).      The exam was otherwise without abnormality. Impression:           - Diverticulosis in the entire examined colon.                       - Non-bleeding internal hemorrhoids.                       - The examination was otherwise normal.                       - No specimens collected. Recommendation:       - Patient has a contact number available for                        emergencies. The signs and symptoms of potential                        delayed complications were discussed with the patient.                        Return to normal activities tomorrow. Written discharge                        instructions were provided to the patient.                       - Resume previous diet.                       - Continue present medications.                       - Resume Plavix (clopidogrel) at prior dose today.  Refer to managing physician for further adjustment of                        therapy.                       - No repeat colonoscopy due to current age 80(66 years or                        older) and the absence of advanced adenomas.                       - Return to GI office PRN.                       - The findings and recommendations were discussed with                        the patient and their family. Procedure Code(s):    --- Professional ---                       [id number], Colorectal cancer screening; colonoscopy  on                        individual at high risk Diagnosis Code(s):    --- Professional ---                       K57.30, Diverticulosis of large intestine without                        perforation or abscess without bleeding                       K64.0, First degree hemorrhoids                       Z86.010, Personal history of colonic polyps CPT copyright 2018 American Medical Association. All rights reserved. The codes documented in this report are preliminary and upon coder review may  be revised to meet current compliance requirements. Stanton Kidneyeodoro K Tol[place] MD, MD 12/02/2017 9:08:58 AM This report has been signed electronically. Number of Addenda: 0 Note Initiated On: 12/02/2017 8:44 AM Scope Withdrawal Time: 0 hours 4 minutes 57 seconds  Total Procedure Duration: 0 hours 12 minutes 30 seconds       [facility]

## 2017-12-02 NOTE — Transfer of Care (Signed)
Immediate Anesthesia Transfer of Care Note  Patient: Omar Schmidt  Procedure(s) Performed: COLONOSCOPY WITH PROPOFOL (N/A )  Patient Location: PACU  Anesthesia Type:General  Level of Consciousness: drowsy  Airway & Oxygen Therapy: Patient Spontanous Breathing and Patient connected to nasal cannula oxygen  Post-op Assessment: Report given to RN and Post -op Vital signs reviewed and stable  Post vital signs: Reviewed and stable  Last Vitals:  Vitals Value Taken Time  BP 90/60 12/02/2017  9:10 AM  Temp    Pulse 65 12/02/2017  9:10 AM  Resp 15 12/02/2017  9:10 AM  SpO2 100 % 12/02/2017  9:10 AM    Last Pain:  Vitals:   12/02/17 0823  TempSrc: Tympanic  PainSc: 0-No pain         Complications: No apparent anesthesia complications

## 2017-12-03 ENCOUNTER — Encounter: Payer: Self-pay | Admitting: Internal Medicine

## 2017-12-03 NOTE — Anesthesia Postprocedure Evaluation (Signed)
Anesthesia Post Note  Patient: Omar Schmidt  Procedure(s) Performed: COLONOSCOPY WITH PROPOFOL (N/A )  Patient location during evaluation: PACU Anesthesia Type: General Level of consciousness: awake and alert Pain management: pain level controlled Vital Signs Assessment: post-procedure vital signs reviewed and stable Respiratory status: spontaneous breathing, nonlabored ventilation, respiratory function stable and patient connected to nasal cannula oxygen Cardiovascular status: blood pressure returned to baseline and stable Postop Assessment: no apparent nausea or vomiting Anesthetic complications: no     Last Vitals:  Vitals:   12/02/17 0930 12/02/17 0933  BP:  108/67  Pulse: (!) 58 64  Resp: 13 15  Temp:    SpO2: 97% 95%    Last Pain:  Vitals:   12/02/17 0910  TempSrc: Tympanic  PainSc:                  [name]
